# Patient Record
Sex: Female | Born: 1975 | Race: Black or African American | Hispanic: No | Marital: Single | Smoking: Never smoker
Health system: Southern US, Community
[De-identification: ages and names within clinical notes are randomized; demographics above are authoritative.]

## PROBLEM LIST (undated history)

## (undated) DIAGNOSIS — E119 Type 2 diabetes mellitus without complications: Secondary | ICD-10-CM

## (undated) DIAGNOSIS — I10 Essential (primary) hypertension: Secondary | ICD-10-CM

## (undated) HISTORY — DX: Essential (primary) hypertension: I10

## (undated) HISTORY — DX: Type 2 diabetes mellitus without complications: E11.9

---

## 2005-02-27 ENCOUNTER — Emergency Department: Payer: Self-pay | Admitting: Internal Medicine

## 2005-12-05 ENCOUNTER — Emergency Department: Payer: Self-pay | Admitting: Emergency Medicine

## 2010-01-12 ENCOUNTER — Emergency Department: Payer: Self-pay | Admitting: Emergency Medicine

## 2010-02-06 ENCOUNTER — Inpatient Hospital Stay: Payer: Self-pay | Admitting: Internal Medicine

## 2011-06-24 ENCOUNTER — Emergency Department: Payer: Self-pay | Admitting: Emergency Medicine

## 2011-06-24 LAB — CBC
HGB: 13.7 g/dL (ref 12.0–16.0)
MCHC: 33.1 g/dL (ref 32.0–36.0)
MCV: 82 fL (ref 80–100)
Platelet: 232 10*3/uL (ref 150–440)
RBC: 5.05 10*6/uL (ref 3.80–5.20)
RDW: 14.1 % (ref 11.5–14.5)

## 2011-06-24 LAB — URINALYSIS, COMPLETE
Bacteria: NONE SEEN
Bilirubin,UR: NEGATIVE
Glucose,UR: >=500 mg/dL (ref 0–75)
Nitrite: NEGATIVE
Protein: NEGATIVE
Specific Gravity: 1.036 (ref 1.003–1.030)
Squamous Epithelial: 1
WBC UR: 4 /HPF (ref 0–5)

## 2011-06-24 LAB — COMPREHENSIVE METABOLIC PANEL
Albumin: 3.8 g/dL (ref 3.4–5.0)
Anion Gap: 12 (ref 7–16)
Bilirubin,Total: 0.5 mg/dL (ref 0.2–1.0)
Calcium, Total: 8.7 mg/dL (ref 8.5–10.1)
Chloride: 101 mmol/L (ref 98–107)
Creatinine: 0.65 mg/dL (ref 0.60–1.30)
Glucose: 325 mg/dL — ABNORMAL HIGH (ref 65–99)
Potassium: 4.1 mmol/L (ref 3.5–5.1)
Sodium: 133 mmol/L — ABNORMAL LOW (ref 136–145)
Total Protein: 7.9 g/dL (ref 6.4–8.2)

## 2011-07-17 ENCOUNTER — Inpatient Hospital Stay: Payer: Self-pay | Admitting: Internal Medicine

## 2011-07-17 LAB — URINALYSIS, COMPLETE
Glucose,UR: >=500 mg/dL (ref 0–75)
Nitrite: NEGATIVE
Protein: NEGATIVE
RBC,UR: 9 /HPF (ref 0–5)
Specific Gravity: 1.024 (ref 1.003–1.030)
WBC UR: 4 /HPF (ref 0–5)

## 2011-07-17 LAB — CBC
HCT: 44.2 % (ref 35.0–47.0)
HGB: 14.1 g/dL (ref 12.0–16.0)
RBC: 5.12 10*6/uL (ref 3.80–5.20)

## 2011-07-17 LAB — BASIC METABOLIC PANEL
Anion Gap: 20 — ABNORMAL HIGH (ref 7–16)
Chloride: 108 mmol/L — ABNORMAL HIGH (ref 98–107)
Co2: 11 mmol/L — ABNORMAL LOW (ref 21–32)
Creatinine: 1.07 mg/dL (ref 0.60–1.30)
Glucose: 410 mg/dL — ABNORMAL HIGH (ref 65–99)
Sodium: 139 mmol/L (ref 136–145)

## 2011-07-17 LAB — HEMOGLOBIN A1C: Hemoglobin A1C: 13.6 % — ABNORMAL HIGH (ref 4.2–6.3)

## 2011-07-18 LAB — CBC WITH DIFFERENTIAL/PLATELET
Bands: 3 %
Comment - H1-Com1: NORMAL
Comment - H1-Com2: NORMAL
HCT: 42 % (ref 35.0–47.0)
Lymphocytes: 12 %
MCV: 84 fL (ref 80–100)
Monocytes: 19 %
Platelet: 240 10*3/uL (ref 150–440)
RBC: 5.03 10*6/uL (ref 3.80–5.20)
RDW: 14.3 % (ref 11.5–14.5)
Segmented Neutrophils: 65 %
WBC: 18.5 10*3/uL — ABNORMAL HIGH (ref 3.6–11.0)

## 2011-07-18 LAB — BASIC METABOLIC PANEL
Anion Gap: 15 (ref 7–16)
Anion Gap: 17 — ABNORMAL HIGH (ref 7–16)
Anion Gap: 13 (ref 7–16)
BUN: 16 mg/dL (ref 7–18)
BUN: 13 mg/dL (ref 7–18)
Calcium, Total: 8.6 mg/dL (ref 8.5–10.1)
Calcium, Total: 7.6 mg/dL — ABNORMAL LOW (ref 8.5–10.1)
Calcium, Total: 8.5 mg/dL (ref 8.5–10.1)
Chloride: 109 mmol/L — ABNORMAL HIGH (ref 98–107)
Chloride: 107 mmol/L (ref 98–107)
Co2: 16 mmol/L — ABNORMAL LOW (ref 21–32)
Creatinine: 0.96 mg/dL (ref 0.60–1.30)
Creatinine: 0.96 mg/dL (ref 0.60–1.30)
Creatinine: 0.42 mg/dL — ABNORMAL LOW (ref 0.60–1.30)
EGFR (African American): >60
EGFR (Non-African Amer.): >60
EGFR (Non-African Amer.): >60
Glucose: 531 mg/dL (ref 65–99)
Osmolality: 285 (ref 275–301)
Osmolality: 306 (ref 275–301)
Osmolality: 280 (ref 275–301)
Potassium: 3.9 mmol/L (ref 3.5–5.1)
Potassium: 3.5 mmol/L (ref 3.5–5.1)
Potassium: 3.8 mmol/L (ref 3.5–5.1)
Sodium: 141 mmol/L (ref 136–145)
Sodium: 138 mmol/L (ref 136–145)

## 2011-07-18 LAB — LIPASE, BLOOD: Lipase: 74 U/L (ref 73–393)

## 2011-07-19 LAB — CBC WITH DIFFERENTIAL/PLATELET
Basophil #: 0.1 x10 3/mm 3
Basophil %: 0.9 %
Eosinophil #: 0.1 x10 3/mm 3
Eosinophil %: 0.8 %
HCT: 38.5 %
HGB: 12.6 g/dL
Lymphocyte %: 27.2 %
Lymphs Abs: 3.5 x10 3/mm 3
MCH: 27.3 pg
MCHC: 32.8 g/dL
MCV: 83 fL
Monocyte #: 1.4 "x10 3/mm " — ABNORMAL HIGH
Monocyte %: 10.5 %
Neutrophil #: 7.8 x10 3/mm 3 — ABNORMAL HIGH
Neutrophil %: 60.6 %
Platelet: 199 x10 3/mm 3
RBC: 4.62 X10 6/mm 3
RDW: 14 %
WBC: 12.9 x10 3/mm 3 — ABNORMAL HIGH

## 2011-07-19 LAB — BASIC METABOLIC PANEL
Anion Gap: 14 (ref 7–16)
BUN: 9 mg/dL (ref 7–18)
Calcium, Total: 8.1 mg/dL — ABNORMAL LOW (ref 8.5–10.1)
EGFR (African American): >60
EGFR (Non-African Amer.): >60
Glucose: 198 mg/dL — ABNORMAL HIGH (ref 65–99)
Osmolality: 282 (ref 275–301)

## 2012-01-02 ENCOUNTER — Emergency Department: Payer: Self-pay | Admitting: Emergency Medicine

## 2012-01-02 LAB — CBC
HGB: 14.2 g/dL (ref 12.0–16.0)
MCH: 27.1 pg (ref 26.0–34.0)
MCHC: 32.9 g/dL (ref 32.0–36.0)
MCV: 83 fL (ref 80–100)
Platelet: 271 10*3/uL (ref 150–440)
RDW: 14 % (ref 11.5–14.5)
WBC: 11.8 10*3/uL — ABNORMAL HIGH (ref 3.6–11.0)

## 2012-01-02 LAB — COMPREHENSIVE METABOLIC PANEL
Anion Gap: 14 (ref 7–16)
BUN: 13 mg/dL (ref 7–18)
Bilirubin,Total: 0.4 mg/dL (ref 0.2–1.0)
Calcium, Total: 9.3 mg/dL (ref 8.5–10.1)
Chloride: 94 mmol/L — ABNORMAL LOW (ref 98–107)
Co2: 20 mmol/L — ABNORMAL LOW (ref 21–32)
Creatinine: 0.9 mg/dL (ref 0.60–1.30)
EGFR (African American): >60
Glucose: 714 mg/dL (ref 65–99)
SGOT(AST): 21 U/L (ref 15–37)
SGPT (ALT): 20 U/L (ref 12–78)
Total Protein: 8.5 g/dL — ABNORMAL HIGH (ref 6.4–8.2)

## 2012-01-02 LAB — URINALYSIS, COMPLETE
Glucose,UR: >=500 mg/dL (ref 0–75)
Ph: 6 (ref 4.5–8.0)
Protein: NEGATIVE
RBC,UR: 3 /HPF (ref 0–5)

## 2012-10-16 IMAGING — CR DG ABDOMEN 3V
1 series · 4 of 4 positions shown · non-contrast
Comparison: none

REASON FOR EXAM: nausea, vomiting,  decreased stools, DM, eval for
gastroparesis.
COMMENTS:

PROCEDURE:     DXR - DXR ABDOMEN 3-WAY (INCL PA CXR)  - July 17, 2011  [DATE]
RESULT:     Comparisons:  None

[Series 2: w chest pa · 0.14mm/px · 4 of 4 slices shown]
[im 1/4]
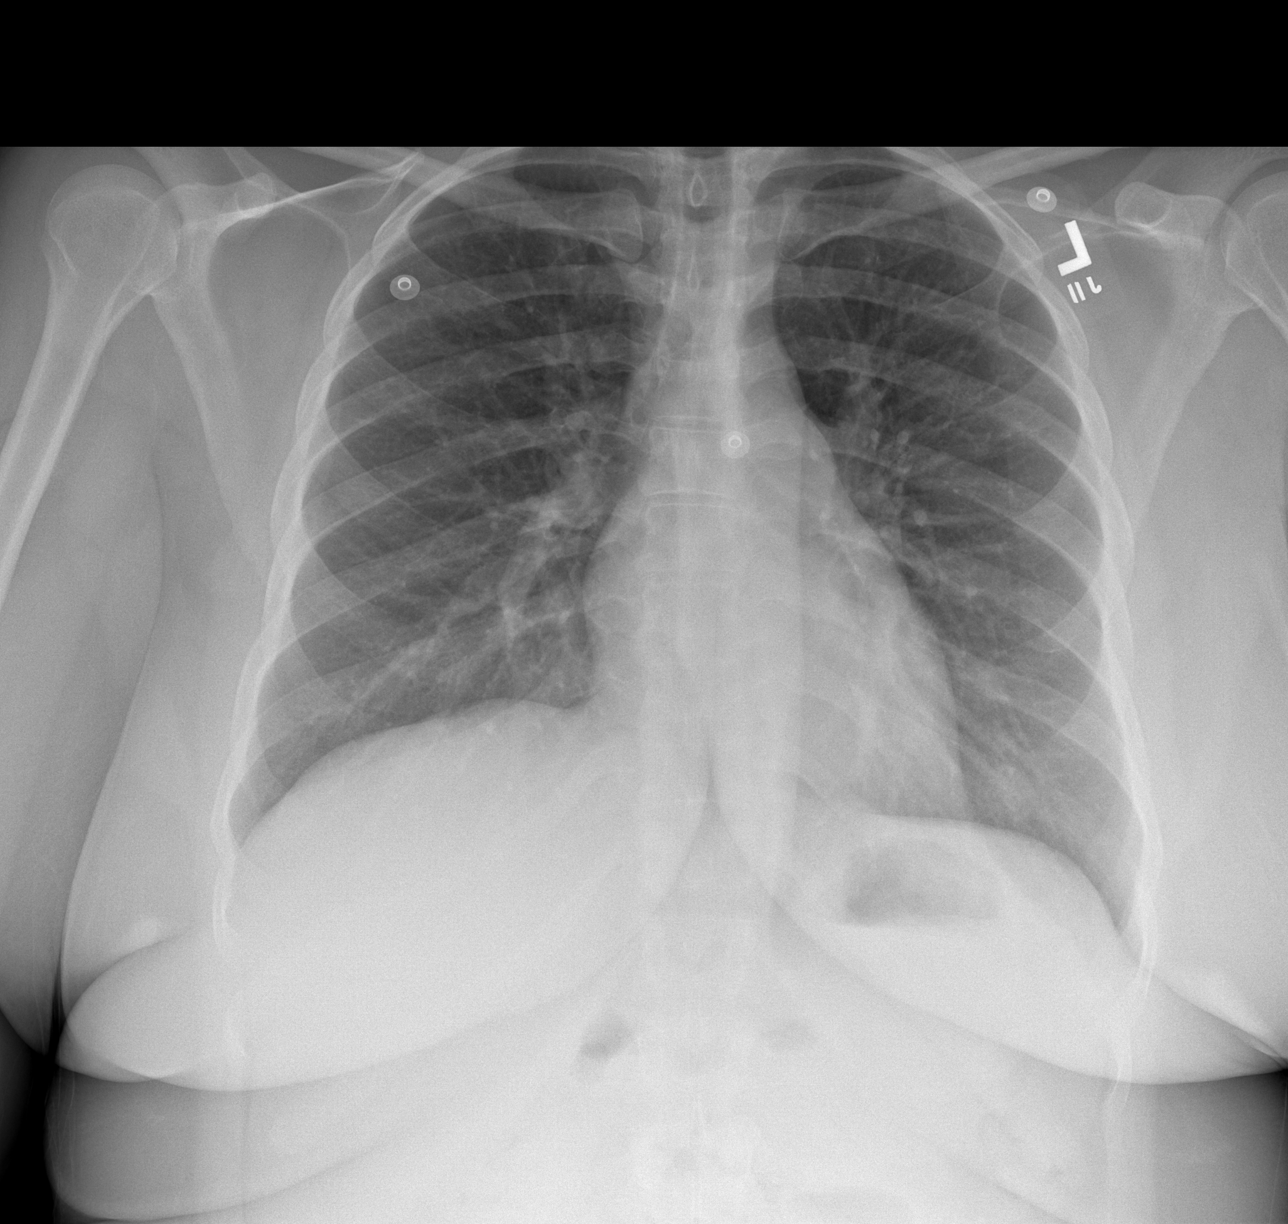
[im 2/4]
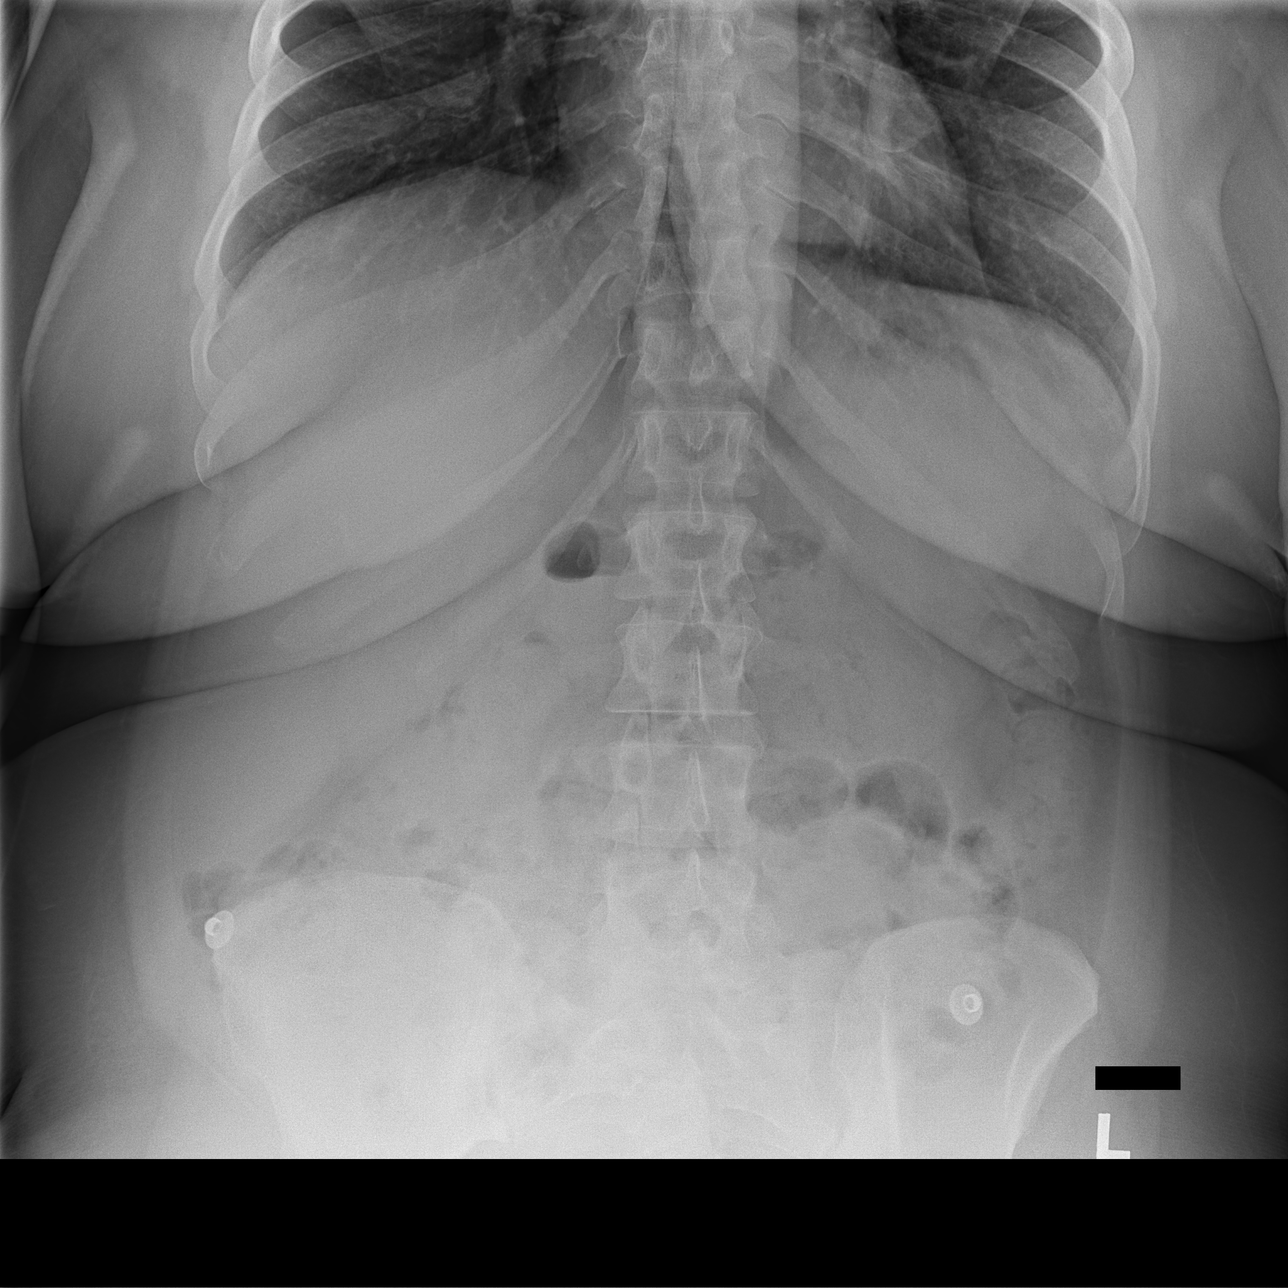
[im 3/4]
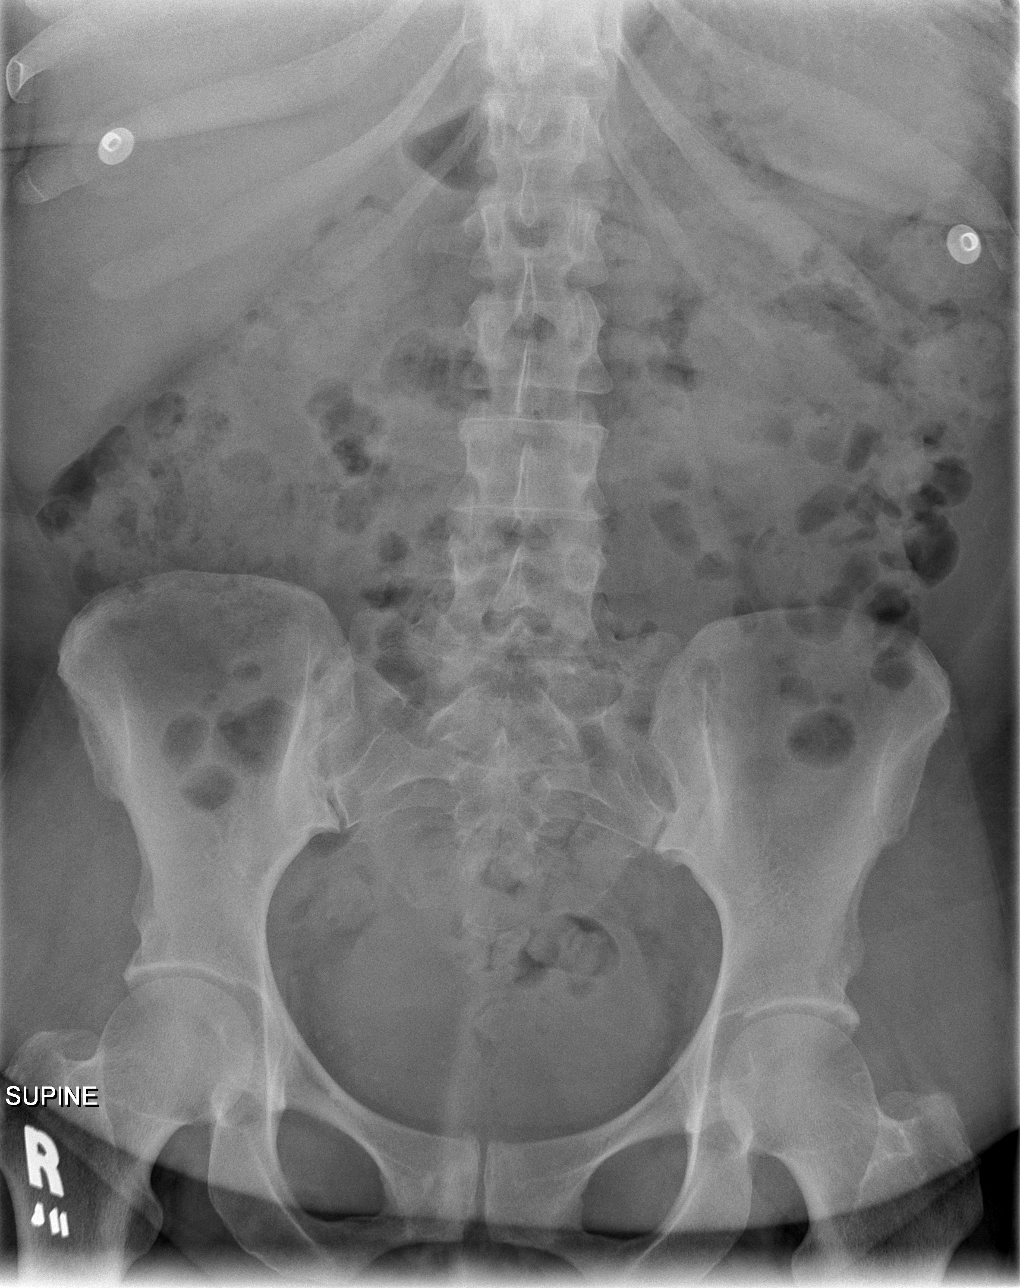
[im 4/4]
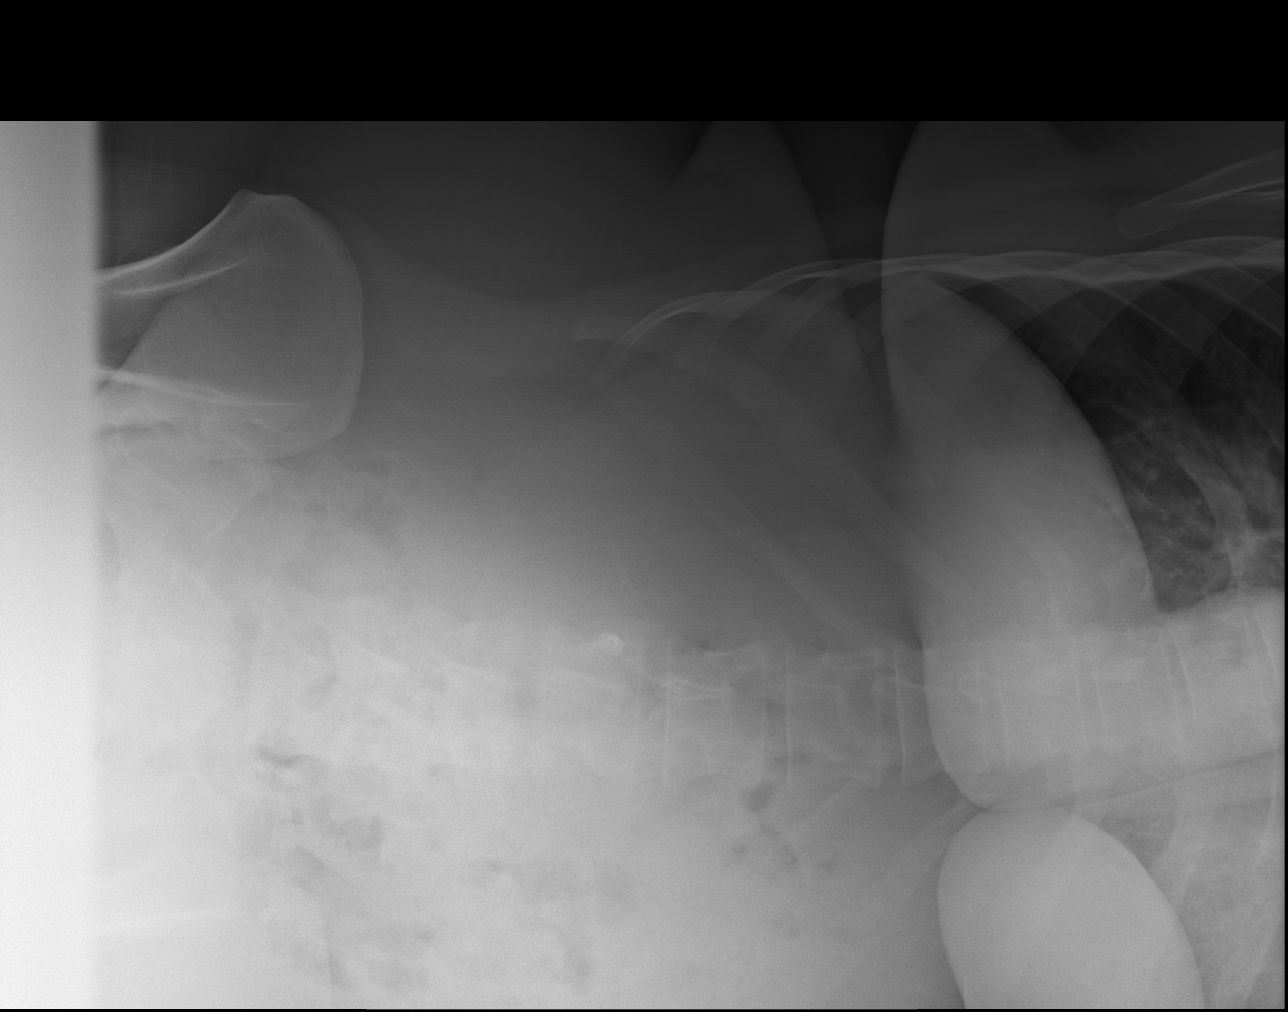

[4 of 4 positions shown; findings below may reference images not displayed]

FINDINGS: PA chest and supine, upright, and left lateral decubitus views of the
abdomen are provided.

The chest is clear. The heart and mediastinum are unremarkable. The osseous
structures are unremarkable.

There is a nonspecific bowel gas pattern. There is no bowel dilatation to
suggest obstruction. There are no air-fluid levels. There are no dilated
loops, bowel wall thickening, or evidence of mass effect.  Soft tissue
shadows are normal. There is no evidence of pneumoperitoneum, portal venous
gas, or pneumatosis.

Osseous structures are unremarkable.
IMPRESSION: Unremarkable abdominal series.

[REDACTED]

## 2014-05-01 NOTE — Discharge Summary (Signed)
PATIENT NAME:  Annette Reynolds, Annette Reynolds MR#:  [id number] DATE OF BIRTH:  1975/05/27  DATE OF ADMISSION:  07/17/2011 DATE OF DISCHARGE:  07/19/2011  ADMITTING DIAGNOSES: Nausea, vomiting, weakness.   DISCHARGE DIAGNOSES:  1. Nausea, vomiting, weakness secondary to diabetic ketoacidosis.  2. Diabetic ketoacidosis likely related to noncompliance. Hemoglobin A1c was around 13.0.  3. Diabetes, likely type 1, poorly controlled.  4. Acute renal failure due to dehydration, now resolved with intravenous hydration.  5. Hyperkalemia, again possibly related to acute renal failure. Her potassium is normal now.  6. Leukocytosis was reactive in nature, is trending downwards to 12,000 without any intervention, likely reactive.   CONSULTANTS: None.   PERTINENT LABORATORY, DIAGNOSTIC AND RADIOLOGICAL DATA:  Glucose on admission was 623, BUN 25, creatinine 1.09, sodium 135, potassium 5.2, chloride 103, CO2 7, anion gap 25, calcium 8.8.  Hemoglobin A1c was 13.6.  Lipase was 74. LFTs showed a total protein of 8.4, albumin 4.0, bilirubin total 0.7, alkaline phosphatase 127.  WBC count on admission was 19.6, hemoglobin 14.1, platelet count 251.  Urinalysis showed leukocyte 1+, bacteria was trace.  Urine pregnancy was negative.  Most recent CBC showed a WBC count of 12.9, hemoglobin 12.6, platelet count was 199.  Chest x-ray showed mixed bacterial organisms suggestive of contamination.   HOSPITAL COURSE: The patient is a 39 year old African American female with previous history of diabetes who was hospitalized here in February 2012. At that time, she was diagnosed with diabetic ketoacidosis with new onset diabetes. At that time, she was discharged home on insulin. She reports that since discharge she started doing much better and was changed to oral regimen, and she was taking her oral regimen and sugars were doing much better. She was eating appropriately until about two years ago and her sugar started increasing. She came  to the ED with complaint of nausea, vomiting, and weakness. She was noted to have diabetic ketoacidosis again. She was treated with aggressive IV fluids and placed on insulin drip. Once her anion closed, she was switched over to 70/30 insulin due to compliance issues and her lack of pay. The patient's sugars have much improved. She has been counseled regarding appropriate diet and diabetic treatment. She is currently stable for discharge.   DISCHARGE MEDICATIONS:  1. Aspirin 81 mg 1 tab p.o. daily.  2. Metformin 1000 mg 1 tab p.o. b.i.d.  3. Lisinopril 10 mg daily.  4. Humulin 70/30 25 units b.i.d.  5. Potassium chloride 20 mEq 1 tab p.o. daily for 4 days.   DIET: Carbohydrate controlled diet.   ACTIVITY: As tolerated.   TIMEFRAME FOR FOLLOW UP: [facility]   TIME SPENT:   40 minutes.  ____________________________ Lacie ScottsShreyang H. Allena KatzPatel, MD shp:cbb D: 07/19/2011 16:12:27 ET T: 07/19/2011 16:45:42 ET JOB#: [id number]  cc: Shelli Portilla H. Allena KatzPatel, MD, <Dictator> [facility] Charise CarwinSHREYANG H Rayson Rando MD ELECTRONICALLY SIGNED 07/19/2011 17:53

## 2014-05-01 NOTE — H&P (Signed)
PATIENT NAME:  Annette Reynolds, Annette Reynolds MR#:  [id number] DATE OF BIRTH:  02/02/1975  DATE OF ADMISSION:  07/17/2011  PRIMARY CARE PHYSICIAN: [facility]   CHIEF COMPLAINT: Nausea, vomiting and weakness.   HISTORY OF PRESENT ILLNESS: This is a 39 year old female who presents to the Emergency Room complaining of nausea, vomiting, and elevated blood sugars ongoing for the past two days. The patient says that she had gone on vacation about a week or so ago to [place]. and her sugars have been elevated since she has come back from vacation. She has also been feeling increasingly tired and has been nauseous and vomiting for the past two days. She said the moment she tries to eat something, she gets nauseous and vomits. She, therefore, came to the ER for further evaluation. She has also been complaining of vague abdominal pains that are generalized, nonradiating and not associated with any diarrhea or constipation.   The patient came to the Emergency Room, was noted to have significantly elevated blood sugars greater than 600 and noted to have an elevated anion gap and noted to be acidotic and in diabetic ketoacidosis. Hospitalist service was then contacted for further treatment and evaluation.   REVIEW OF SYSTEMS: CONSTITUTIONAL: No documented fever. No weight gain or weight loss. EYES: No blurred or double vision. ENT: No tinnitus. No postnasal drip. No redness of the oropharynx. RESPIRATORY: No cough, no wheeze, no hemoptysis, no dyspnea. CARDIOVASCULAR: No chest pain, no orthopnea, no palpitations or syncope. GASTROINTESTINAL: Nausea. Positive vomiting, no diarrhea, no abdominal pain, no melena, no hematochezia. GU: No dysuria, no hematuria, positive frequency. ENDOCRINE: No polyuria, no nocturia, no heat or cold intolerance. Positive increased thirst. HEME: No anemia, no bruising, no bleeding. INTEGUMENTARY: No rashes. No lesions. MUSCULOSKELETAL: No arthritis, no swelling, and no gout. NEUROLOGIC: No  numbness, no tingling, no ataxia, no seizure-type activity. PSYCH: No anxiety, no insomnia, no ADD.   PAST MEDICAL HISTORY:  1. Diabetes.  2. Psoriasis.   ALLERGIES: No known drug allergies.   SOCIAL HISTORY: No smoking. Occasional alcohol use. No illicit drug abuse. Lives at home with her two kids.   FAMILY HISTORY: Significant for diabetes on her father's side of family.   CURRENT MEDICATIONS:  1. Aspirin 81 mg daily.  2. Glipizide 10 mg daily.  3. Lisinopril 10 mg daily. 4. Metformin 1000 mg b.i.d.   PHYSICAL EXAMINATION ON ADMISSION:  VITAL SIGNS: Temperature 95, pulse 86, respirations 24, blood pressure 119/62. Sats 98% on room air.   GENERAL: She is a pleasant appearing female in no apparent distress.   HEENT: Atraumatic, normocephalic. Her extraocular muscles are intact. Her pupils are equal and reactive to light. Sclerae anicteric. No conjunctival injection. No oropharyngeal erythema.   NECK: Supple. No jugular venous distention. No bruits, no lymphadenopathy or thyromegaly.   HEART: Tachycardic, regular. No murmurs, no rubs, no clicks.   LUNGS: Clear to auscultation bilaterally. No rales, no rhonchi, no wheezes.   ABDOMEN: Soft, flat, nontender, nondistended. Has good bowel sounds. No hepatosplenomegaly appreciated.   EXTREMITIES: No evidence of any cyanosis, clubbing, or peripheral edema. Has +2 pedal and radial pulses bilaterally.   NEUROLOGIC: The patient is alert, awake, and oriented x3 with no focal motor or sensory deficits appreciated bilaterally.   SKIN: Moist, warm. She does have some psoriatic plaques on her lower extremities bilaterally, also on her scalp.   LYMPHATIC: There is no cervical or axillary lymphadenopathy.   LABORATORY, DIAGNOSTIC, AND RADIOLOGICAL DATA: Serum glucose of 623, BUN  25, creatinine 1.09, sodium 135, potassium 5.2, chloride 103, bicarbonate 7, anion gap of 25. LFTs are within normal limits. White cell count 19.6, hemoglobin 14.1,  hematocrit 44.2, platelet count 251, pH of 7.19, pO2 112, pCO2 is not known.   ASSESSMENT AND PLAN: This is a 39 year old female with a history of diabetic ketoacidosis, diabetes, history of psoriasis, who presents to the hospital due to weakness, nausea, vomiting, noted to be in DKA.  1. Diabetic ketoacidosis. The exact reason the patient went into DKA is currently unclear. Questionable if this is related to noncompliance. Although the patient says that she has been taking her medications regularly. She had a similar presentation like this in February 2012 and was discharged on insulin, but currently is not on insulin and just on p.o. meds. The patient said that when she was on insulin last year she was having frequent episodes of hypoglycemia and therefore was taken off insulin. This likely needs to be reassessed during this hospitalization. For now, I will give her aggressive IV fluids. Place her on insulin drip. Follow serial metabolic profiles. Add potassium to her fluids when her potassium gets below 4.5. Check a hemoglobin A1c. The patient may benefit from outpatient endocrine referral. Follow her clinically.  2. Acute renal failure. This is likely related to dehydration and diabetic ketoacidosis. For now we will continue IV fluids. Follow BUN and creatinine and urine output. Renal dose her medications. Avoid nephrotoxins.  3. Hyperkalemia, likely related to the acute renal failure and also acidosis, should improve with IV fluids and correction of her DKA. Will follow her potassium closely.  4. Leukocytosis. I think this is likely stress mediated from her DKA. I do not appreciate any evidence of infectious source. I will follow her white cell count closely. Hold off on antibiotics for now.   CODE STATUS: The patient is a FULL CODE.   CRITICAL CARE TIME SPENT: 50 minutes. ____________________________ Rolly PancakeVivek J. Cherlynn KaiserSainani, MD vjs:ap D: 07/17/2011 18:20:28 ET                 T: 07/18/2011 06:57:07 ET                   JOB#: [id number] cc: Rolly PancakeVivek J. Cherlynn KaiserSainani, MD, <Dictator> [facility] [place] SirenVIVEK J Donivin Wirt MD ELECTRONICALLY SIGNED 07/18/2011 14:16

## 2014-08-08 ENCOUNTER — Emergency Department: Payer: Self-pay

## 2014-08-08 ENCOUNTER — Emergency Department
Admission: EM | Admit: 2014-08-08 | Discharge: 2014-08-09 | Disposition: A | Discharge status: Home / Self Care | Payer: Self-pay | Attending: Emergency Medicine | Admitting: Emergency Medicine

## 2014-08-08 DIAGNOSIS — O210 Mild hyperemesis gravidarum: Secondary | ICD-10-CM | POA: Insufficient documentation

## 2014-08-08 DIAGNOSIS — E119 Type 2 diabetes mellitus without complications: Secondary | ICD-10-CM | POA: Insufficient documentation

## 2014-08-08 DIAGNOSIS — Z3A01 Less than 8 weeks gestation of pregnancy: Secondary | ICD-10-CM | POA: Insufficient documentation

## 2014-08-08 DIAGNOSIS — Z794 Long term (current) use of insulin: Secondary | ICD-10-CM | POA: Insufficient documentation

## 2014-08-08 DIAGNOSIS — N83202 Unspecified ovarian cyst, left side: Secondary | ICD-10-CM

## 2014-08-08 DIAGNOSIS — E860 Dehydration: Secondary | ICD-10-CM | POA: Insufficient documentation

## 2014-08-08 DIAGNOSIS — O3481 Maternal care for other abnormalities of pelvic organs, first trimester: Secondary | ICD-10-CM | POA: Insufficient documentation

## 2014-08-08 DIAGNOSIS — N8320 Unspecified ovarian cysts: Secondary | ICD-10-CM | POA: Insufficient documentation

## 2014-08-08 DIAGNOSIS — O24111 Pre-existing diabetes mellitus, type 2, in pregnancy, first trimester: Secondary | ICD-10-CM | POA: Insufficient documentation

## 2014-08-08 DIAGNOSIS — O10911 Unspecified pre-existing hypertension complicating pregnancy, first trimester: Secondary | ICD-10-CM | POA: Insufficient documentation

## 2014-08-08 LAB — URINALYSIS WITH MICROSCOPIC
Bilirubin, UA: NEGATIVE
Blood, UA: NEGATIVE
Glucose, UA: >=500 — AB
Ketones UA: 5 — AB
Leukocyte Esterase, UA: NEGATIVE
Nitrite, UA: NEGATIVE
Protein, UR: NEGATIVE
Specific Gravity UA: >1.035 — AB (ref 1.001–1.035)
Urine pH: 6 (ref 5.0–8.0)
Urobilinogen, UA: NEGATIVE mg/dL

## 2014-08-08 LAB — CBC AND DIFFERENTIAL
Basophils Absolute Automated: 0.03 10*3/uL (ref 0.00–0.20)
Basophils Automated: 0 %
Eosinophils Absolute Automated: 0.08 10*3/uL (ref 0.00–0.70)
Eosinophils Automated: 1 %
Hematocrit: 39 % (ref 37.0–47.0)
Hgb: 13.6 g/dL (ref 12.0–16.0)
Immature Granulocytes Absolute: 0.04 10*3/uL
Immature Granulocytes: 0 %
Lymphocytes Absolute Automated: 3.28 10*3/uL (ref 0.50–4.40)
Lymphocytes Automated: 28 %
MCH: 28.8 pg (ref 28.0–32.0)
MCHC: 34.9 g/dL (ref 32.0–36.0)
MCV: 82.6 fL (ref 80.0–100.0)
MPV: 11.7 fL (ref 9.4–12.3)
Monocytes Absolute Automated: 1.08 10*3/uL (ref 0.00–1.20)
Monocytes: 9 %
Neutrophils Absolute: 7.47 10*3/uL (ref 1.80–8.10)
Neutrophils: 62 %
Nucleated RBC: 0 /100 WBC (ref 0–1)
Platelets: 261 10*3/uL (ref 140–400)
RBC: 4.72 10*6/uL (ref 4.20–5.40)
RDW: 13 % (ref 12–15)
WBC: 11.94 10*3/uL — ABNORMAL HIGH (ref 3.50–10.80)

## 2014-08-08 LAB — BLOOD GAS, VENOUS
Base Excess, Ven: -2.6 mEq/L
HCO3, Ven: 21.2 mEq/L
O2 Sat, Venous: 80.8 %
Temperature: 98.6
Venous Total CO2: 22.3 mEq/L
pCO2, Venous: 35.6 mmhg
pH, Ven: 7.394
pO2, Venous: <52.4 mmhg

## 2014-08-08 LAB — COMPREHENSIVE METABOLIC PANEL
ALT: 14 U/L (ref 0–55)
AST (SGOT): 21 U/L (ref 5–34)
Albumin/Globulin Ratio: 1 (ref 0.9–2.2)
Albumin: 3.6 g/dL (ref 3.5–5.0)
Alkaline Phosphatase: 107 U/L — ABNORMAL HIGH (ref 37–106)
Anion Gap: 12 (ref 5.0–15.0)
BUN: 11 mg/dL (ref 7–19)
Bilirubin, Total: 0.4 mg/dL (ref 0.2–1.2)
CO2: 17 mEq/L — ABNORMAL LOW (ref 22–29)
Calcium: 8.9 mg/dL (ref 8.5–10.5)
Chloride: 103 mEq/L (ref 100–111)
Creatinine: 0.9 mg/dL (ref 0.6–1.0)
Globulin: 3.5 g/dL (ref 2.0–3.6)
Glucose: 333 mg/dL — ABNORMAL HIGH (ref 70–100)
Potassium: 4.8 mEq/L (ref 3.5–5.1)
Protein, Total: 7.1 g/dL (ref 6.0–8.3)
Sodium: 132 mEq/L — ABNORMAL LOW (ref 136–145)

## 2014-08-08 LAB — URINE HCG QUALITATIVE: Urine HCG Qualitative: POSITIVE — AB

## 2014-08-08 LAB — GLUCOSE WHOLE BLOOD - POCT
Whole Blood Glucose POCT: 369 mg/dL — ABNORMAL HIGH (ref 70–100)
Whole Blood Glucose POCT: 269 mg/dL — ABNORMAL HIGH (ref 70–100)

## 2014-08-08 LAB — LIPASE: Lipase: 33 U/L (ref 8–78)

## 2014-08-08 LAB — GFR: EGFR: >60

## 2014-08-08 LAB — PT AND APTT
PT INR: 0.9 (ref 0.9–1.1)
PT: 12.6 s (ref 12.6–15.0)
PTT: 22 s — ABNORMAL LOW (ref 23–37)

## 2014-08-08 LAB — HCG QUANTITATIVE: hCG, Quant.: 8308.8

## 2014-08-08 LAB — MAGNESIUM: Magnesium: 2.6 mg/dL (ref 1.6–2.6)

## 2014-08-08 LAB — TYPE AND SCREEN
AB Screen Gel: NEGATIVE
ABO Rh: O POS

## 2014-08-08 MED ORDER — ALUM & MAG HYDROXIDE-SIMETH 200-200-20 MG/5ML PO SUSP
30.0000 mL | Freq: Once | ORAL | Status: AC
Start: 2014-08-08 — End: 2014-08-08
  Administered 2014-08-08: 30 mL via ORAL
  Filled 2014-08-08: qty 30

## 2014-08-08 MED ORDER — FAMOTIDINE 20 MG PO TABS
20.0000 mg | ORAL_TABLET | Freq: Once | ORAL | Status: AC
Start: 2014-08-08 — End: 2014-08-08
  Administered 2014-08-08: 20 mg via ORAL
  Filled 2014-08-08: qty 1

## 2014-08-08 MED ORDER — METOCLOPRAMIDE HCL 5 MG PO TABS
10.0000 mg | ORAL_TABLET | Freq: Three times a day (TID) | ORAL | Status: AC | PRN
Start: 2014-08-08 — End: 2014-08-11

## 2014-08-08 MED ORDER — METOCLOPRAMIDE HCL 5 MG/ML IJ SOLN
10.0000 mg | Freq: Once | INTRAMUSCULAR | Status: AC
Start: 2014-08-08 — End: 2014-08-08
  Administered 2014-08-08: 10 mg via INTRAVENOUS
  Filled 2014-08-08: qty 2

## 2014-08-08 MED ORDER — SODIUM CHLORIDE 0.9 % IV BOLUS
1000.0000 mL | Freq: Once | INTRAVENOUS | Status: AC
Start: 2014-08-08 — End: 2014-08-08
  Administered 2014-08-08: 1000 mL via INTRAVENOUS

## 2014-08-08 MED ORDER — ALUM & MAG HYDROXIDE-SIMETH 200-200-20 MG/5ML PO SUSP
5.0000 mL | Freq: Four times a day (QID) | ORAL | Status: AC | PRN
Start: 2014-08-08 — End: 2014-08-15

## 2014-08-08 MED ORDER — ACETAMINOPHEN 325 MG PO TABS
650.0000 mg | ORAL_TABLET | Freq: Four times a day (QID) | ORAL | Status: AC | PRN
Start: 2014-08-08 — End: 2014-08-15

## 2014-08-08 NOTE — Special Discharge Instructions (Signed)
Please stop taking lisinopril and glipizide.   Please see obgyn and internal medicine within the week.

## 2014-08-08 NOTE — Discharge Instructions (Signed)
Hyperemesis Gravidarum    You have been seen for vomiting in pregnancy.   Severe vomiting that lasts a long time is called "hyperemesis."    Nausea and vomiting are very common in the first 12 weeks of pregnancy. The cause is not well understood. If you have abdominal (belly) pain with the nausea and vomiting this is a sign of a more serious problem.    If vomiting and loss of appetite cause weight loss, this can change your body's electrolytes. This can affect the baby.     Vomiting during pregnancy is treated with medication nausea. The medications promethazine (Phenergan), metoclopramide (Reglan), prochlorperazine (Compazine) and ondasentron (Zofran) are safe to use for a short time during pregnancy.    If the doctor prescribes medications, use them as directed to help stop the vomiting.    Eat and drink frequently. Take only small bites or sips at a time. This may help you to keep the food or liquid down.    See your obstetrician in the next few days.    YOU SHOULD SEEK MEDICAL ATTENTION IMMEDIATELY, EITHER HERE OR AT THE NEAREST EMERGENCY DEPARTMENT, IF ANY OF THE FOLLOWING OCCURS:   Your vomiting continues even with nausea medication.   You see blood in your vomit.   You have trouble eating or keeping liquids down because of nausea.   You develop abdominal (belly) pain.             Ovarian Cyst    You have been diagnosed with an ovarian cyst.    Ovarian cysts are common in women with abdominal (belly) pain, pelvic pain or cramping.    Ovarian cysts are like small bubbles filled with fluid. These cysts are also called "ovarian follicles." They form in the ovary during a normal menstrual cycle (period). During your cycle, an egg ripens inside the cyst. It is getting ready for ovulation. Sometimes a follicle gets large or many follicles form. This can cause pelvic pain or cramps.    If the cyst gets too big, it may break open. When this happens, it releases blood and fluid into the abdomen  (belly). This causes sudden severe pain. The pain often gets better within a few hours with no treatment. Some people need pain medicine.    A few women have a condition called polycystic ovarian disease. In these women, several cysts form on both ovaries during the menstrual cycle. This causes irregular (unpredictable) menstrual periods. This condition is caused by hormones.    To diagnose polycystic ovarian disease, a doctor usually examines a woman's pelvis and asks questions about her symptoms. If the doctor cannot feel the cysts with his or her hands, an ultrasound may be needed.    Emergency ultrasound testing for cysts is rarely needed. Instead, the test is usually during regular weekday office hours in the radiology department. The medical staff will schedule the test in the next few days. You may also see your regular doctor instead to schedule the ultrasound.    The doctor has decided it is OK for you to go home.    If symptoms signal complications like infection or bleeding, you may need to come here or to the nearest Emergency Department.    Follow up with your gynecologist or regular doctor in the next few days. Tell your doctor about this visit.   If you do not have a doctor or clinic to follow up with, tell the medical staff before leaving today. We can help make   the arrangements for you.    YOU SHOULD SEEK MEDICAL ATTENTION IMMEDIATELY, EITHER HERE OR AT THE NEAREST EMERGENCY DEPARTMENT, IF ANY OF THE FOLLOWING OCCURS:   You have worse pain in the abdomen (belly), pelvis or back.   More and more vaginal discharge or bleeding, or passing large blood clots.   Fever (temperature higher than 100.4F / 38C), chills, nausea, vomiting (throwing up).   Feeling dizzy, lightheaded or passing out.

## 2014-08-08 NOTE — ED Notes (Signed)
Patient states high blood glucose of 320 today. States abdominal pain, nausea, and vomiting. Also states taking 5 pregnancy tests. 2 were positive, 2 were negative, and 1 was inconclusive.

## 2014-08-08 NOTE — ED Provider Notes (Addendum)
Physician/Midlevel provider first contact with patient: 08/08/14 1948         History     Chief Complaint   Patient presents with   . Hyperglycemia     HPI   39 yo female with htn, dm, and psoriasis, presents with 3 days of epigastric and umbilical soreness moderate and episodic, with concominant n/v food like recurrently. With no diarrhea. With last bm nml brown today. With lmp 8 weeks ago.  Pt denies vaginal [place]. No  Bleeding. No change in urination, no polyuria, no hematuria, no dysuria. Pt states vomiting more often in the am.  States has taken pregnancy tests with mixed results for past 3 days. Pt is G3P2 with no prior abdominal surgeries or std.  Pt does have hx of episodic minor anterior headaches similar to prior with no changes in vision, hearing, sensation, strength or coordination, gradual in onset and episodes of light head on standing with thirst. Pt states labile finger sticks for this period of time from 400-50 at home. No other complaints.       Past Medical History   Diagnosis Date   . Diabetes mellitus    . Hypertension        Past Surgical History   Procedure Laterality Date   . Breast surgery         No family history on file.    Social  Social History   Substance Use Topics   . Smoking status: Never Smoker    . Smokeless tobacco: None   . Alcohol Use: Yes       .     Allergies   Allergen Reactions   . Latex    . Sulfa Antibiotics        Home Medications     Last Medication Reconciliation Action:  Complete Ellamae Sia, RN 08/08/2014  7:00 PM                  glipiZIDE (GLUCOTROL) 10 MG tablet     Take 10 mg by mouth 2 (two) times daily before meals.     insulin aspart (NOVOLOG) 100 UNIT/ML injection     Inject into the skin 3 (three) times daily before meals.     lisinopril (PRINIVIL,ZESTRIL) 10 MG tablet     Take 10 mg by mouth daily.     metFORMIN (GLUCOPHAGE) 1000 MG tablet     Take 1,000 mg by mouth 2 (two) times daily with meals.           Review of Systems   Constitutional: Negative for  fever and chills.   HENT: Negative for sore throat.    Eyes: Negative for photophobia and visual disturbance.   Respiratory: Negative for apnea, cough, choking, chest tightness, shortness of breath, wheezing and stridor.    Cardiovascular: Negative for chest pain, palpitations and leg swelling.   Gastrointestinal: Positive for nausea, vomiting and abdominal pain. Negative for diarrhea, constipation, blood in stool and abdominal distention.   Genitourinary: Negative for dysuria, urgency, frequency, hematuria, flank pain and pelvic pain.   Musculoskeletal: Negative for back pain, arthralgias and gait problem.   Skin: Negative for wound.   Neurological: Positive for headaches. Negative for weakness.   Psychiatric/Behavioral: Negative for agitation.       Physical Exam    BP: 133/74 mmHg, Heart Rate: 82, Temp: 98 F (36.7 C), Resp Rate: 16, SpO2: 97 %    Physical Exam   Constitutional: She is oriented to person, place, and  time. She appears well-developed and well-nourished. No distress.   HENT:   Head: Normocephalic and atraumatic.   Dry mucous membranes.    Eyes: EOM are normal. Pupils are equal, round, and reactive to light.   Neck: Normal range of motion.   Cardiovascular: Normal rate, regular rhythm and normal heart sounds.  Exam reveals no gallop and no friction rub.    No murmur heard.  Pulmonary/Chest: Effort normal and breath sounds normal. No respiratory distress. She has no wheezes. She has no rales. She exhibits no tenderness.   Abdominal: Soft. Bowel sounds are normal. She exhibits no distension and no mass. There is tenderness. There is no rebound and no guarding. No hernia.   Mildly tender epigastrium and umbilical area, with tenderness to LLQ as well. No guarding or rebound. No cva tenderness.    Genitourinary: Vagina normal and uterus normal.   No [place], no rash. No adnexal tenderness. No cmt, os closed, no bleeding.    Musculoskeletal: Normal range of motion. She exhibits no edema or tenderness.    Neurological: She is alert and oriented to person, place, and time. No cranial nerve deficit.   nml strength and sensation throughout.    Skin: Skin is warm and dry. No rash noted. She is not diaphoretic. No erythema. No pallor.   Psychiatric: She has a normal mood and affect.         MDM and ED Course     ED Medication Orders     Start Ordered     Status Ordering Provider    08/08/14 1951 08/08/14 1950  sodium chloride 0.9 % bolus 1,000 mL   Once     Route: Intravenous  Ordered Dose: 1,000 mL     Last MAR action:  New Bag Shirlee More S    08/08/14 1951 08/08/14 1950  metoclopramide (REGLAN) injection 10 mg   Once     Route: Intravenous  Ordered Dose: 10 mg     Last MAR action:  Given Marilyne Haseley S    08/08/14 1951 08/08/14 1950  alum & mag hydroxide-simethicone (MAALOX PLUS) 200-200-20 mg/5 mL suspension 30 mL   Once     Route: Oral  Ordered Dose: 30 mL     Last MAR action:  Given Romelia Bromell S    08/08/14 1951 08/08/14 1950  famotidine (PEPCID) tablet 20 mg   Once     Route: Oral  Ordered Dose: 20 mg     Last MAR action:  Given Jordie Schreur S             MDM  Number of Diagnoses or Management Options  Cyst of left ovary: new and requires workup  Hyperemesis gravidarum: new and requires workup     Amount and/or Complexity of Data Reviewed  Clinical lab tests: ordered and reviewed  Tests in the radiology section of CPT: ordered and reviewed  Tests in the medicine section of CPT: ordered and reviewed    Risk of Complications, Morbidity, and/or Mortality  Presenting problems: moderate  Diagnostic procedures: moderate  Management options: moderate    Patient Progress  Patient progress: improved            Procedures    Clinical Impression & Disposition     Clinical Impression  Final diagnoses:   None   39 yo female with abdominal pain - most likely pregnant with hyperemesis gravidarum.  Will do labs with lipase, hepatic panel and blood hcg and type and screen. If pregnant  will do pelvic  sono.   With regard to dehydration and hyperglycemia - will give 1 L NS, labs with vbg.   Will give reglan and maalox. Will reassess.     ED Disposition     None       lab work shows + HCG, no acidosis, RH +. US shows IUP with fetal pole and lft sided hemoraghic ovarian cyst at 2.7 cm with no free fluid. Pt improved clinically and glucose dropped, no longer tender abd. PO tolerant. Pt will [place] with internal medicine and gyn f/u within week. Will stop glipizide and lisinopril.  Will rx reglan, tylenol, and maalox.     New Prescriptions    No medications on file                   Daylene Posey, MD  08/08/14 2335    Daylene Posey, MD  08/09/14 0125    Daylene Posey, MD  08/09/14 [phone]

## 2015-09-11 ENCOUNTER — Emergency Department: Payer: PRIVATE HEALTH INSURANCE

## 2015-09-11 ENCOUNTER — Emergency Department
Admission: EM | Admit: 2015-09-11 | Discharge: 2015-09-11 | Disposition: A | Discharge status: Home / Self Care | Payer: PRIVATE HEALTH INSURANCE | Attending: Emergency Medicine | Admitting: Emergency Medicine

## 2015-09-11 DIAGNOSIS — O209 Hemorrhage in early pregnancy, unspecified: Secondary | ICD-10-CM

## 2015-09-11 DIAGNOSIS — I10 Essential (primary) hypertension: Secondary | ICD-10-CM | POA: Insufficient documentation

## 2015-09-11 DIAGNOSIS — Z794 Long term (current) use of insulin: Secondary | ICD-10-CM | POA: Insufficient documentation

## 2015-09-11 DIAGNOSIS — O039 Complete or unspecified spontaneous abortion without complication: Secondary | ICD-10-CM | POA: Insufficient documentation

## 2015-09-11 DIAGNOSIS — D259 Leiomyoma of uterus, unspecified: Secondary | ICD-10-CM | POA: Insufficient documentation

## 2015-09-11 DIAGNOSIS — E119 Type 2 diabetes mellitus without complications: Secondary | ICD-10-CM | POA: Insufficient documentation

## 2015-09-11 LAB — URINALYSIS, REFLEX TO MICROSCOPIC EXAM IF INDICATED
Bilirubin, UA: NEGATIVE
Glucose, UA: 50 — AB
Ketones UA: NEGATIVE
Nitrite, UA: NEGATIVE
Protein, UR: >=500 — AB
Specific Gravity UA: 1.019 (ref 1.001–1.035)
Urine pH: 6 (ref 5.0–8.0)
Urobilinogen, UA: NEGATIVE mg/dL

## 2015-09-11 LAB — COMPREHENSIVE METABOLIC PANEL
ALT: 7 U/L (ref 0–55)
AST (SGOT): 9 U/L (ref 5–34)
Albumin/Globulin Ratio: 0.9 (ref 0.9–2.2)
Albumin: 3.4 g/dL — ABNORMAL LOW (ref 3.5–5.0)
Alkaline Phosphatase: 101 U/L (ref 37–106)
Anion Gap: 10 (ref 5.0–15.0)
BUN: 10 mg/dL (ref 7–19)
Bilirubin, Total: 0.3 mg/dL (ref 0.2–1.2)
CO2: 24 mEq/L (ref 22–29)
Calcium: 9 mg/dL (ref 8.5–10.5)
Chloride: 105 mEq/L (ref 100–111)
Creatinine: 0.7 mg/dL (ref 0.6–1.0)
Globulin: 3.7 g/dL — ABNORMAL HIGH (ref 2.0–3.6)
Glucose: 197 mg/dL — ABNORMAL HIGH (ref 70–100)
Potassium: 3.7 mEq/L (ref 3.5–5.1)
Protein, Total: 7.1 g/dL (ref 6.0–8.3)
Sodium: 139 mEq/L (ref 136–145)

## 2015-09-11 LAB — CBC AND DIFFERENTIAL
Absolute NRBC: 0 10*3/uL
Basophils Absolute Automated: 0.07 10*3/uL (ref 0.00–0.20)
Basophils Automated: 0.6 %
Eosinophils Absolute Automated: 0.11 10*3/uL (ref 0.00–0.70)
Eosinophils Automated: 0.9 %
Hematocrit: 34.6 % — ABNORMAL LOW (ref 37.0–47.0)
Hgb: 11.3 g/dL — ABNORMAL LOW (ref 12.0–16.0)
Immature Granulocytes Absolute: 0.04 10*3/uL
Immature Granulocytes: 0.3 %
Lymphocytes Absolute Automated: 2.02 10*3/uL (ref 0.50–4.40)
Lymphocytes Automated: 17.3 %
MCH: 27.4 pg — ABNORMAL LOW (ref 28.0–32.0)
MCHC: 32.7 g/dL (ref 32.0–36.0)
MCV: 84 fL (ref 80.0–100.0)
MPV: 11.6 fL (ref 9.4–12.3)
Monocytes Absolute Automated: 1.02 10*3/uL (ref 0.00–1.20)
Monocytes: 8.7 %
Neutrophils Absolute: 8.4 10*3/uL — ABNORMAL HIGH (ref 1.80–8.10)
Neutrophils: 72.2 %
Nucleated RBC: 0 /100 WBC (ref 0.0–1.0)
Platelets: 252 10*3/uL (ref 140–400)
RBC: 4.12 10*6/uL — ABNORMAL LOW (ref 4.20–5.40)
RDW: 13 % (ref 12–15)
WBC: 11.66 10*3/uL — ABNORMAL HIGH (ref 3.50–10.80)

## 2015-09-11 LAB — ABO/RH: ABO Rh: O POS

## 2015-09-11 LAB — GFR: EGFR: >60

## 2015-09-11 LAB — HCG QUANTITATIVE: hCG, Quant.: 2929.1

## 2015-09-11 NOTE — ED Provider Notes (Signed)
Physician/Midlevel provider first contact with patient: 09/11/15 0316         History     Chief Complaint   Patient presents with   . Vaginal Bleeding-pregnant     HPI Comments: Per patient, approximately 8w pregnant by LMP w/ vaginal bleeding and clots tonight.  Cramping earlier in day but resolved.  Currently no pain, just passing a lot of clots.  No tissue.  Denies fevers, urinary symptoms.    Patient is a 40 y.o. female presenting with vaginal bleeding during pregnancy. The history is provided by the patient and the spouse. No language interpreter was used.   Vaginal Bleeding-pregnant  Quality:  Clots  Severity:  Moderate  Onset quality:  Gradual  Duration:  1 day  Timing:  Constant  Progression:  Unchanged  Chronicity:  New  Gestational age:  G55P2  Ineffective treatments:  None tried  Associated symptoms: no abdominal pain, no dizziness, no dysuria, no fatigue and no fever             Past Medical History   Diagnosis Date   . Diabetes mellitus    . Hypertension        Past Surgical History   Procedure Laterality Date   . Breast surgery     . Cesarian       low transverse       History reviewed. No pertinent family history.    Social  Social History   Substance Use Topics   . Smoking status: Never Smoker    . Smokeless tobacco: None   . Alcohol Use: Yes       .     Allergies   Allergen Reactions   . Latex    . Sulfa Antibiotics        Home Medications     Last Medication Reconciliation Action:  Complete Mickle Asper, RN 09/11/2015  3:29 AM                  insulin aspart (NOVOLOG) 100 UNIT/ML injection     Inject into the skin 3 (three) times daily before meals.     metFORMIN (GLUCOPHAGE) 1000 MG tablet     Take 1,000 mg by mouth 2 (two) times daily with meals.           Review of Systems   Constitutional: Negative for fever and fatigue.   Gastrointestinal: Negative for abdominal pain.   Genitourinary: Negative for dysuria.   Neurological: Negative for dizziness.   All other systems reviewed and are  negative.      Physical Exam    BP: 135/88 mmHg, Heart Rate: (!) 111, Temp: 97.9 F (36.6 C), Resp Rate: 19, SpO2: 100 %, Weight: 81.647 kg    Physical Exam   Constitutional: She is oriented to person, place, and time. She appears well-developed and well-nourished. No distress.   HENT:   Head: Normocephalic and atraumatic.   Nose: Nose normal.   Mouth/Throat: Oropharynx is clear and moist.   Eyes: Conjunctivae and EOM are normal. Pupils are equal, round, and reactive to light.   Neck: Normal range of motion. Neck supple.   Cardiovascular: Normal rate, regular rhythm, normal heart sounds and intact distal pulses.    Pulmonary/Chest: Effort normal and breath sounds normal. No respiratory distress.   Abdominal: Soft. She exhibits no distension. There is no tenderness.   No CVA TTP   Genitourinary:   Patient deferred   Musculoskeletal: Normal range of motion. She exhibits no edema.  Neurological: She is alert and oriented to person, place, and time. No cranial nerve deficit. She exhibits normal muscle tone. Coordination normal.   Skin: Skin is warm and dry. No rash noted.   Psychiatric: She has a normal mood and affect. Her behavior is normal. Judgment normal.   Nursing note and vitals reviewed.        MDM and ED Course     ED Medication Orders     None             MDM  Number of Diagnoses or Management Options  Spontaneous miscarriage:   Vaginal bleeding in pregnancy, first trimester:   Diagnosis management comments: BP 114/64 mmHg  Pulse 98  Temp(Src) 98 F (36.7 C) (Oral)  Resp 18  Ht 5\' 2"  (1.575 m)  Wt 81.647 kg  BMI 32.91 kg/m2  SpO2 98%  LMP 07/17/2015    HDS, afebrile w/ painless vaginal bleeding in first trimester w/ benign abdominal exam in ED.  No IP seen on U/S and patient refused further imaging or exam in ED.  H/H stable. Remainder evaluation at/near baseline.  Likely spontaneous Ab. Will d/c with return precautions and OB follow up this week.    Results for orders placed or performed during the  hospital encounter of 09/11/15  -CBC with differential       Result                                            Value                         Ref Range                       WBC                                               11.66 (H)                     3.50 - 10.80 x10 3/uL           Hgb                                               11.3 (L)                      12.0 - 16.0 g/dL                Hematocrit                                        34.6 (L)                      37.0 - 47.0 %                   Platelets  252                           140 - 400 x10 3/uL              RBC                                               4.12 (L)                      4.20 - 5.40 x10 6/uL            MCV                                               84.0                          80.0 - 100.0 fL                 MCH                                               27.4 (L)                      28.0 - 32.0 pg                  MCHC                                              32.7                          32.0 - 36.0 g/dL                RDW                                               13                            12 - 15 %                       MPV                                               11.6                          9.4 - 12.3 fL  Neutrophils                                       72.2                          None %                          Lymphocytes Automated                             17.3                          None %                          Monocytes                                         8.7                           None %                          Eosinophils Automated                             0.9                           None %                          Basophils Automated                               0.6                           None %                          Immature Granulocyte                              0.3                           None %                           Nucleated RBC                                     0.0                           0.0 - 1.0 /100 WBC  Neutrophils Absolute                              8.40 (H)                      1.80 - 8.10 x10 3/uL            Abs Lymph Automated                               2.02                          0.50 - 4.40 x10 3/uL            Abs Mono Automated                                1.02                          0.00 - 1.20 x10 3/uL            Abs Eos Automated                                 0.11                          0.00 - 0.70 x10 3/uL            Absolute Baso Automated                           0.07                          0.00 - 0.20 x10 3/uL            Absolute Immature Granulocyte                     0.04                          0 x10 3/uL                      Absolute NRBC                                     0.00                          0 x10 3/uL                 -Comprehensive metabolic panel       Result                                            Value  Ref Range                       Glucose                                           197 (H)                       70 - 100 mg/dL                  BUN                                               10                            7 - 19 mg/dL                    Creatinine                                        0.7                           0.6 - 1.0 mg/dL                 Sodium                                            139                           136 - 145 mEq/L                 Potassium                                         3.7                           3.5 - 5.1 mEq/L                 Chloride                                          105                           100 - 111 mEq/L                 CO2  24                            22 - 29 mEq/L                   Calcium                                           9.0                           8.5 - 10.5 mg/dL                 Protein, Total                                    7.1                           6.0 - 8.3 g/dL                  Albumin                                           3.4 (L)                       3.5 - 5.0 g/dL                  AST (SGOT)                                        9                             5 - 34 U/L                      ALT                                               7                             0 - 55 U/L                      Alkaline Phosphatase                              101                           37 - 106 U/L                    Bilirubin, Total  0.3                           0.2 - 1.2 mg/dL                 Globulin                                          3.7 (H)                       2.0 - 3.6 g/dL                  Albumin/Globulin Ratio                            0.9                           0.9 - 2.2                       Anion Gap                                         10.0                          5.0 - 15.0                 -hCG, Quantitative       Result                                            Value                         Ref Range                       hCG, Quant.                                       2929.1                        See below                  -UA with reflex to micro (pts  3 + yrs)       Result                                            Value                         Ref Range                       Urine Type  Clean Catch                                                   Color, UA                                         Yellow                        Clear - Yellow                  Clarity, UA                                       Sl Cloudy (A)                 Clear - Hazy                    Specific Gravity UA                               1.019                         1.001-1.035                     Urine pH                                          6.0                           5.0-8.0                          Leukocyte Esterase, UA                            Trace (A)                     Negative                        Nitrite, UA                                       Negative                      Negative                        Protein, UR                                       >=500 (A)  Negative                        Glucose, UA                                       50 (A)                        Negative                        Ketones UA                                        Negative                      Negative                        Urobilinogen, UA                                  Negative                      0.2  -  2.0 mg/dL               Bilirubin, UA                                     Negative                      Negative                        Blood, UA                                         Moderate (A)                  Negative                        RBC, UA                                           TNTC (A)                      0 - 5 /hpf                      WBC, UA                                           6 - 10 (A)  0 - 5 /hpf                 -GFR       Result                                            Value                         Ref Range                       EGFR                                              >60.0                                                    -ABO/Rh       Result                                            Value                         Ref Range                       ABO Rh                                            O POS                                                      Results for orders placed or performed during the hospital encounter of 09/11/15  -US Pelvic W Transvaginal                                                                                                                    Narrative    CLINICAL HISTORY: Vaginal bleeding. Pregnant.        COMPARISON: None        FINDINGS:         Limited transabdominal  and transvaginal ultrasound of the pelvis was    performed. The uterus  measures 13 x 8 x 7.7 cm. There is a 3.9 x 3.6 x    3.4 cm focal mass posteriorly. The endometrial canal measures 1.1 cm.    The right ovary measures 2.5 x 1.9 x 2.5 cm and the left measures 2.4 x    1.4 x 2.3 cm on transvaginal imaging. Both ovaries show normal flow.    There is no evidence of intrauterine pregnancy. The patient stopped the    examination.                                                                                                                        Impression    Limited examination due to the patient's request to stop the exam    showing no intrauterine pregnancy.        3.9 cm fibroid of the uterus.        Devona Konig, MD     09/11/2015 6:33 AM      D/W patient ED course, treatment plan, lab/imaging results, discharge instructions and return precautions prior to discharge.  Patient understands and agrees with plan of care.         Amount and/or Complexity of Data Reviewed  Clinical lab tests: reviewed  Tests in the radiology section of CPT: reviewed  Obtain history from someone other than the patient: yes  Review and summarize past medical records: yes    Patient Progress  Patient progress: improved            Procedures    Clinical Impression & Disposition     Clinical Impression  Final diagnoses:   Spontaneous miscarriage   Vaginal bleeding in pregnancy, first trimester        ED Disposition     Discharge [place] Brandvold discharge to home/self care.    Condition at disposition: Stable             Discharge Medication List as of 09/11/2015  6:44 AM                      Windell Hummingbird, DO  09/11/15 [phone]

## 2015-09-11 NOTE — ED Notes (Signed)
Patient assisted to the bathroom and she had a golf size clot that fell on the floor with other clots that were the size of quarters. The patient assisted by nurse to the room and cleaned up.

## 2015-09-11 NOTE — Discharge Instructions (Signed)
Miscarriage, Completed    After your evaluation today, it looks like you had a miscarriage.    It is a fairly common condition in the first trimester (12 weeks) of pregnancy. Half of all pregnant women have bleeding during their pregnancy. Half of these women go on to have a normal pregnancy and the other half go on to have a miscarriage. Up to 1 in 4 of all pregnancies can end in miscarriage. There is nothing that you did that caused the miscarriage and nothing you could have done to prevent it. Doctors can't do anything to stop a miscarriage from happening.    It is OK to go home now.    You can expect to have some more cramping pain and to pass some blood or blood clots.    A few things you can do to help avoid complications after a miscarriage are:   Get plenty of rest. Avoid vigorous activities like heavy lifting, standing for a long time and very hard physical work-outs. Avoid sexual intercourse and douching. If you must go back to work, ask your doctor about work restrictions.   Stay well-hydrated at all times by drinking plenty of fluids.    Close follow-up care in the next few days with your OB doctor is important.    You may need to return here or go to the nearest Emergency Department if you have more symptoms that might signal miscarriage complications.    YOU SHOULD SEEK MEDICAL ATTENTION IMMEDIATELY, EITHER HERE OR AT THE NEAREST EMERGENCY DEPARTMENT, IF ANY OF THE FOLLOWING OCCURS:   More pain in the abdomen (belly), pelvis or back.   A lot of vaginal bleeding, soaking pads/tampons (more than one pad per hour), passing large clots.   Fever (temperature higher than 100.4F / 38C), chills, nausea (sick to your stomach), vomiting (throwing up).   Feeling dizzy, lightheaded or passing out.

## 2015-09-11 NOTE — ED Notes (Signed)
Patient was at home when she woke up to a large amount of blood in the bed and she has a clot in a plastic baggie that she brought in.

## 2020-03-23 ENCOUNTER — Encounter (HOSPITAL_BASED_OUTPATIENT_CLINIC_OR_DEPARTMENT_OTHER): Payer: Self-pay
